# Patient Record
Sex: Female | Born: 2000 | Race: White | Hispanic: No | Marital: Single | Smoking: Never smoker
Health system: Southern US, Community
[De-identification: ages and names within clinical notes are randomized; demographics above are authoritative.]

## PROBLEM LIST (undated history)

## (undated) DIAGNOSIS — Q170 Accessory auricle: Secondary | ICD-10-CM

## (undated) HISTORY — DX: Accessory auricle: Q17.0

## (undated) HISTORY — PX: OTHER SURGICAL HISTORY: SHX169

---
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
## 2022-01-03 ENCOUNTER — Ambulatory Visit (INDEPENDENT_AMBULATORY_CARE_PROVIDER_SITE_OTHER): Payer: BC Managed Care – PPO | Admitting: Pediatrics

## 2022-01-03 ENCOUNTER — Encounter: Payer: Self-pay | Admitting: Pediatrics

## 2022-01-03 VITALS — BP 98/56 | Ht 65.8 in | Wt 132.2 lb

## 2022-01-03 DIAGNOSIS — Z68.41 Body mass index (BMI) pediatric, 5th percentile to less than 85th percentile for age: Secondary | ICD-10-CM | POA: Diagnosis not present

## 2022-01-03 DIAGNOSIS — Z1331 Encounter for screening for depression: Secondary | ICD-10-CM | POA: Diagnosis not present

## 2022-01-03 DIAGNOSIS — Z0000 Encounter for general adult medical examination without abnormal findings: Secondary | ICD-10-CM

## 2022-01-03 DIAGNOSIS — Z00129 Encounter for routine child health examination without abnormal findings: Secondary | ICD-10-CM

## 2022-02-06 ENCOUNTER — Telehealth: Payer: Self-pay | Admitting: Pediatrics

## 2022-02-06 NOTE — Telephone Encounter (Signed)
Mother called requesting to speak with Calla Kicks, NP. Mother is inquiring about female gynecologists for the patient and if [facility] has any recommend providers.   Clinton Quant [phone]

## 2022-02-07 NOTE — Telephone Encounter (Signed)
Danielle Neal has been struggling with severe menstrual cramps and would like to see a female gynecologist. Recommended mom and [name] look at local practice websites to find female GYNs, then call to schedule appointment. Mom verbalized agreement.

## 2022-02-24 ENCOUNTER — Encounter: Payer: Self-pay | Admitting: Pediatrics

## 2022-03-25 DIAGNOSIS — Z01419 Encounter for gynecological examination (general) (routine) without abnormal findings: Secondary | ICD-10-CM | POA: Diagnosis not present

## 2022-10-14 ENCOUNTER — Telehealth: Payer: Self-pay

## 2022-10-14 MED ORDER — AMOXICILLIN 500 MG PO CAPS: 500.0000 mg | ORAL_CAPSULE | Freq: Two times a day (BID) | ORAL | 0 refills | Status: AC

## 2022-10-14 NOTE — Telephone Encounter (Signed)
Mother is stating that Danielle Neal is exhibiting some sinus pressure and congestion. Mother explained that Danielle Neal is in college and would not be able to come into office for a sick visit as she was offered. Mother would like to treat for sinus infection as sibling has come into office and tested positive for the infection they were near each other and she worries if the infection was spread. Sibling was seen by Dr. Laurice Record mother requested message to be sent to PCP as Dr. Laurice Record is not in office.   Best pharmacy: [facility] [address] [place], [place], [place] [address]

## 2022-10-14 NOTE — Telephone Encounter (Signed)
Will treat for bacterial sinusitis.

## 2023-02-27 ENCOUNTER — Encounter: Payer: Self-pay | Admitting: Family Medicine

## 2023-02-27 ENCOUNTER — Ambulatory Visit (INDEPENDENT_AMBULATORY_CARE_PROVIDER_SITE_OTHER): Payer: PRIVATE HEALTH INSURANCE | Admitting: Family Medicine

## 2023-02-27 VITALS — BP 100/60 | HR 80 | Ht 64.96 in | Wt 134.0 lb

## 2023-02-27 DIAGNOSIS — F418 Other specified anxiety disorders: Secondary | ICD-10-CM

## 2023-02-27 NOTE — Progress Notes (Signed)
Established Patient Office Visit  Subjective   Patient ID: Danielle Neal, female    DOB: Dec 11, 2000  Age: 22 y.o. MRN: [id number]  No chief complaint on file.   HPI   Danielle Neal is seen to establish care.  I have seen her mom and dad for regular medical care.  She has aged out of her previous pediatric practice.  She also sees an OB/GYN and is prescribed birth control.  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  Majoring in Forensic scientist and plans to get her masters in chemistry as well.  Past medical history reviewed.  No chronic medical problems.  History of wisdom tooth extraction but no other surgeries.  No known allergies.  Takes no regular medications other than birth control.  Immunizations reviewed and up-to-date.  She will need tetanus booster next year  Her only real issue today is she states she has some situational anxiety.  She has tended to be anxious in the past for things like exams.  Sometimes this manifests as abdominal pain.  Denies any social anxiety or panic disorder.  No generalized anxiety symptoms.  Denies any depression symptoms.  Past Medical History:  Diagnosis Date   Congenital accessory tragus    Past Surgical History:  Procedure Laterality Date   EXTERNAL EAR SURGERY Bilateral feb 2005   Correction of ear lobse folds   left elbow fracture     age 12   right wrist fracture     age 15    reports that she has never smoked. She has never been exposed to tobacco smoke. She has never used smokeless tobacco. She reports that she does not drink alcohol and does not use drugs. family history includes Cancer in her maternal grandmother; Hearing loss in her maternal grandmother. No Known Allergies  Review of Systems  Constitutional:  Negative for weight loss.  Respiratory:  Negative for shortness of breath.   Cardiovascular:  Negative for chest pain.  Psychiatric/Behavioral:  Negative for depression and substance abuse. The patient is nervous/anxious. The patient  does not have insomnia.       Objective:     BP 100/60 (BP Location: Left Arm, Patient Position: Sitting, Cuff Size: Normal)   Pulse 80   Ht 5' 4.96" (1.65 m)   Wt 134 lb (60.8 kg)   LMP 02/25/2023 (Exact Date)   SpO2 99%   BMI 22.33 kg/m  BP Readings from Last 3 Encounters:  02/27/23 100/60  01/03/22 (!) 98/56  01/03/21 100/70   Wt Readings from Last 3 Encounters:  02/27/23 134 lb (60.8 kg)  01/03/22 132 lb 3.2 oz (60 kg) (60%, Z= 0.24)*  01/03/21 135 lb 4.8 oz (61.4 kg) (69%, Z= 0.48)*   * Growth percentiles are based on CDC (Girls, 2-20 Years) data.      Physical Exam Vitals reviewed.  Constitutional:      General: She is not in acute distress.    Appearance: Normal appearance. She is not ill-appearing.  Cardiovascular:     Rate and Rhythm: Normal rate and regular rhythm.     Heart sounds: No murmur heard. Pulmonary:     Effort: Pulmonary effort is normal.     Breath sounds: Normal breath sounds. No wheezing or rales.  Musculoskeletal:     Cervical back: Neck supple.     Right lower leg: No edema.     Left lower leg: No edema.  Lymphadenopathy:     Cervical: No cervical adenopathy.  Neurological:  General: No focal deficit present.     Mental Status: She is alert.  Psychiatric:        Mood and Affect: Mood normal.        Thought Content: Thought content normal.      No results found for any visits on 02/27/23.    The ASCVD Risk score (Arnett DK, et al., 2019) failed to calculate for the following reasons:   The 2019 ASCVD risk score is only valid for ages 66 to 28    Assessment & Plan:   Situational stress/anxiety.  She relates difficulties at times managing anxiety and stress related to things like exams.  We discussed nonpharmacologic therapy with adequate sleep, exercise, good sound diet.  She does not drink a lot of caffeine.  Did mention possible counseling if symptoms progress.  We both agreed that medications are not indicated at this  time  Kristian Covey MD [facility], [place]

## 2023-03-24 ENCOUNTER — Encounter: Payer: Self-pay | Admitting: Pediatrics

## 2023-04-06 ENCOUNTER — Encounter: Payer: Self-pay | Admitting: Family Medicine

## 2024-01-28 ENCOUNTER — Ambulatory Visit
Admission: EM | Admit: 2024-01-28 | Discharge: 2024-01-28 | Disposition: A | Discharge status: Home / Self Care | Payer: PRIVATE HEALTH INSURANCE

## 2024-01-28 DIAGNOSIS — Z203 Contact with and (suspected) exposure to rabies: Secondary | ICD-10-CM | POA: Diagnosis not present

## 2024-01-28 NOTE — Discharge Instructions (Addendum)
 You were evaluated today due to close contact with a stray kitten that was later confirmed to have rabies. Although you had frequent interaction with the kitten, including petting and kissing its head, you reported no bites, scratches, or open wounds. The risk of rabies transmission in this situation is extremely low, as the virus is typically spread through saliva entering broken skin or mucous membranes. Casual contact, including touching the animal or exposure to saliva on intact skin, is not considered a high-risk exposure.  At this time, no treatment is necessary based on your reported exposure. You were counseled on the signs and symptoms of rabies, which may include fever, headache, confusion, difficulty swallowing, excessive drooling, or changes in behavior. These symptoms usually appear weeks after a high-risk exposure, not from casual contact.  If you develop any unusual symptoms, or if you later recall an injury such as a bite or scratch that broke the skin, return here or contact your primary care provider right away.  If you change your mind and wish to proceed with the rabies vaccination series, you may return to the clinic to begin treatment.   Seek emergency care if you experience sudden confusion, difficulty breathing or swallowing, or neurological changes such as seizures or hallucinations.   Follow up with your primary care provider if you have additional concerns or questions.

## 2024-01-28 NOTE — ED Triage Notes (Signed)
 Pt present with exposure to rabies last month.   Denies any bites. Pt states the cat had a cut on the leg. Pt states she held the cat but was not bitten.

## 2024-01-28 NOTE — ED Provider Notes (Signed)
 [facility]    CSN: [id number] Arrival date & time: 01/28/24  1859      History   Chief Complaint Chief Complaint  Patient presents with   rabies exposure    HPI Danielle Neal is a 23 y.o. female.   Discussed the use of AI scribe software for clinical note transcription with the patient, who gave verbal consent to proceed.   Patient presents with concerns about potential rabies exposure from a stray kitten. The patient found a stray kitten a few weeks ago, which was initially fine but later developed a wound on its leg. The kitten was treated with antibiotics for two weeks and seemed to improve. However, last Sunday, the kitten became lethargic and stopped eating. A veterinarian suspected rabies, and a test confirmed the diagnosis today. The patient reports significant contact with the kitten, including kissing it on the head and petting it. She denies any cuts, scratches or bites from the animal. The patient's neighbors, who were also around the kitten, received the first dose of the rabies vaccine on Monday at the ER, despite not having any bites or scratches, only licks from the kitten. The patient expresses concern and uncertainty about how to proceed, given the lack of experience with rabies exposure and the absence of visible wounds or bites.   The following portions of the patient's history were reviewed and updated as appropriate: allergies, current medications, past family history, past medical history, past social history, past surgical history, and problem list.    Past Medical History:  Diagnosis Date   Congenital accessory tragus     Patient Active Problem List   Diagnosis Date Noted   Acute swimmer's ear of right side 05/06/2017   Encounter for routine child health examination without abnormal findings 01/06/2017   Well adolescent visit 12/31/2015   BMI (body mass index), pediatric, 5% to less than 85% for age 98/17/2016    Past Surgical History:   Procedure Laterality Date   EXTERNAL EAR SURGERY Bilateral feb 2005   Correction of ear lobse folds   left elbow fracture     age 36   right wrist fracture     age 25    OB History   No obstetric history on file.      Home Medications    Prior to Admission medications   Medication Sig Start Date End Date Taking? Authorizing Provider  cetirizine  (ZYRTEC ) 10 MG tablet TAKE ONE TABLET BY MOUTH ONE TIME DAILY  11/17/13   Londell Lynwood NOVAK, MD  fluticasone  (FLONASE ) 50 MCG/ACT nasal spray PLACE 2 SPRAYS INTO BOTH NOSTRILS DAILY. 03/24/16   Darrol Merck, MD    Family History Family History  Problem Relation Age of Onset   Hearing loss Maternal Grandmother    Cancer Maternal Grandmother        Uterine   Alcohol abuse Neg Hx    Arthritis Neg Hx    Asthma Neg Hx    Birth defects Neg Hx    COPD Neg Hx    Depression Neg Hx    Diabetes Neg Hx    Drug abuse Neg Hx    Early death Neg Hx    Heart disease Neg Hx    Hyperlipidemia Neg Hx    Hypertension Neg Hx    Kidney disease Neg Hx    Learning disabilities Neg Hx    Mental illness Neg Hx    Mental retardation Neg Hx    Miscarriages / Stillbirths Neg Hx  Stroke Neg Hx    Vision loss Neg Hx    Varicose Veins Neg Hx     Social History Social History   Tobacco Use   Smoking status: Never    Passive exposure: Never   Smokeless tobacco: Never  Vaping Use   Vaping status: Never Used  Substance Use Topics   Alcohol use: No   Drug use: Never     Allergies   Patient has no known allergies.   Review of Systems Review of Systems  Constitutional:  Negative for fatigue and fever.  Skin:  Negative for rash and wound.  Neurological:  Negative for dizziness and headaches.  All other systems reviewed and are negative.    Physical Exam Triage Vital Signs ED Triage Vitals  Encounter Vitals Group     BP 01/28/24 1917 (!) 138/95     Girls Systolic BP Percentile --      Girls Diastolic BP Percentile --      Boys  Systolic BP Percentile --      Boys Diastolic BP Percentile --      Pulse Rate 01/28/24 1917 (!) 103     Resp 01/28/24 1917 17     Temp 01/28/24 1917 98 F (36.7 C)     Temp Source 01/28/24 1917 Oral     SpO2 01/28/24 1917 98 %     Weight 01/28/24 1921 131 lb 3.2 oz (59.5 kg)     Height --      Head Circumference --      Peak Flow --      Pain Score 01/28/24 1916 0     Pain Loc --      Pain Education --      Exclude from Growth Chart --    No data found.  Updated Vital Signs BP (!) 138/95 (BP Location: Right Arm)   Pulse (!) 103   Temp 98 F (36.7 C) (Oral)   Resp 17   Wt 131 lb 3.2 oz (59.5 kg)   LMP 01/21/2024 (Exact Date)   SpO2 98%   BMI 21.86 kg/m   Visual Acuity Right Eye Distance:   Left Eye Distance:   Bilateral Distance:    Right Eye Near:   Left Eye Near:    Bilateral Near:     Physical Exam Vitals reviewed.  Constitutional:      General: She is awake. She is not in acute distress.    Appearance: Normal appearance. She is well-developed. She is not ill-appearing, toxic-appearing or diaphoretic.  HENT:     Head: Normocephalic.     Right Ear: Hearing normal.     Left Ear: Hearing normal.     Nose: Nose normal.     Mouth/Throat:     Mouth: Mucous membranes are moist.  Eyes:     General: Vision grossly intact.     Conjunctiva/sclera: Conjunctivae normal.  Cardiovascular:     Rate and Rhythm: Normal rate and regular rhythm.     Heart sounds: Normal heart sounds.  Pulmonary:     Effort: Pulmonary effort is normal.     Breath sounds: Normal breath sounds and air entry.  Musculoskeletal:        General: Normal range of motion.     Cervical back: Full passive range of motion without pain, normal range of motion and neck supple.  Skin:    General: Skin is warm and dry.     Findings: No abrasion, signs of injury, laceration, rash or wound.  Neurological:     General: No focal deficit present.     Mental Status: She is alert and oriented to person,  place, and time.  Psychiatric:        Speech: Speech normal.        Behavior: Behavior is cooperative.      UC Treatments / Results  Labs (all labs ordered are listed, but only abnormal results are displayed) Labs Reviewed - No data to display  EKG   Radiology No results found.  Procedures Procedures (including critical care time)  Medications Ordered in UC Medications - No data to display  Initial Impression / Assessment and Plan / UC Course  I have reviewed the triage vital signs and the nursing notes.  Pertinent labs & imaging results that were available during my care of the patient were reviewed by me and considered in my medical decision making (see chart for details).     Patient presents following close contact with a stray kitten that was later confirmed to have rabies. The kitten had been in contact with the patient and her family for several weeks prior to developing signs of illness. The patient denies any bites, scratches, or open wounds, but does report frequent close interaction with the animal, including kissing its head and petting. Given the absence of any break in the skin or mucous membrane exposure, the risk of rabies transmission is extremely low. Rabies is primarily transmitted through saliva via bites or contact with broken skin or mucous membranes, and casual contact--such as petting or exposure to saliva on intact skin--is not considered a high-risk exposure.  A detailed discussion was held with the patient and her mother regarding the very low likelihood of rabies transmission in this context. They were counseled on symptoms of rabies to monitor for and were advised to return if new concerns arise or if they choose to proceed with the rabies vaccination series. Follow-up with the primary care provider was recommended for any additional guidance or questions. No treatment indicated at this time based on current risk assessment.  Today's evaluation has  revealed no signs of a dangerous process. Discussed diagnosis with patient and/or guardian. Patient and/or guardian aware of their diagnosis, possible red flag symptoms to watch out for and need for close follow up. Patient and/or guardian understands verbal and written discharge instructions. Patient and/or guardian comfortable with plan and disposition.  Patient and/or guardian has a clear mental status at this time, good insight into illness (after discussion and teaching) and has clear judgment to make decisions regarding their care  Documentation was completed with the aid of voice recognition software. Transcription may contain typographical errors. Final Clinical Impressions(s) / UC Diagnoses   Final diagnoses:  Contact with and exposure to rabies     Discharge Instructions      You were evaluated today due to close contact with a stray kitten that was later confirmed to have rabies. Although you had frequent interaction with the kitten, including petting and kissing its head, you reported no bites, scratches, or open wounds. The risk of rabies transmission in this situation is extremely low, as the virus is typically spread through saliva entering broken skin or mucous membranes. Casual contact, including touching the animal or exposure to saliva on intact skin, is not considered a high-risk exposure.  At this time, no treatment is necessary based on your reported exposure. You were counseled on the signs and symptoms of rabies, which may include fever, headache, confusion, difficulty swallowing,  excessive drooling, or changes in behavior. These symptoms usually appear weeks after a high-risk exposure, not from casual contact.  If you develop any unusual symptoms, or if you later recall an injury such as a bite or scratch that broke the skin, return here or contact your primary care provider right away.  If you change your mind and wish to proceed with the rabies vaccination series, you  may return to the clinic to begin treatment.   Seek emergency care if you experience sudden confusion, difficulty breathing or swallowing, or neurological changes such as seizures or hallucinations.   Follow up with your primary care provider if you have additional concerns or questions.      ED Prescriptions   None    PDMP not reviewed this encounter.   Iola Lukes, [place] 01/28/24 2034

## 2024-01-29 ENCOUNTER — Encounter: Payer: Self-pay | Admitting: Advanced Practice Midwife
